# Patient Record
Sex: Male | Born: 1985 | Race: White | Hispanic: No | Marital: Married | Smoking: Never smoker
Health system: Southern US, Community
[De-identification: ages and names within clinical notes are randomized; demographics above are authoritative.]

## PROBLEM LIST (undated history)

## (undated) DIAGNOSIS — L709 Acne, unspecified: Secondary | ICD-10-CM

## (undated) SURGERY — Surgical Case
Anesthesia: *Unknown

---

## 2014-01-17 ENCOUNTER — Ambulatory Visit: Payer: Self-pay | Admitting: Family Medicine

## 2014-01-17 VITALS — BP 126/78 | HR 93 | Temp 98.8°F | Resp 17 | Ht 70.5 in | Wt 151.0 lb

## 2014-01-17 DIAGNOSIS — L678 Other hair color and hair shaft abnormalities: Secondary | ICD-10-CM

## 2014-01-17 DIAGNOSIS — L0100 Impetigo, unspecified: Secondary | ICD-10-CM

## 2014-01-17 DIAGNOSIS — L700 Acne vulgaris: Secondary | ICD-10-CM

## 2014-01-17 DIAGNOSIS — L739 Follicular disorder, unspecified: Secondary | ICD-10-CM

## 2014-01-17 DIAGNOSIS — L738 Other specified follicular disorders: Secondary | ICD-10-CM

## 2014-01-17 DIAGNOSIS — L708 Other acne: Secondary | ICD-10-CM

## 2014-01-17 MED ORDER — CHLORHEXIDINE GLUCONATE 4 % EX LIQD: Freq: Every day | CUTANEOUS | Status: DC | PRN

## 2014-01-17 MED ORDER — DOXYCYCLINE HYCLATE 100 MG PO CAPS: 100.0000 mg | ORAL_CAPSULE | Freq: Two times a day (BID) | ORAL | Status: DC

## 2014-01-17 NOTE — Progress Notes (Signed)
Subjective:    Patient ID: Timothy Graham Timothy Graham, male    DOB: February 08, 1986, 28 y.o.   MRN: [id number] Chief Complaint  Patient presents with  . rash all over body   This chart was scribed for Norberto SorensonEva Shaw, MD by Nicholos Johnsenise Iheanachor, Medical Scribe. This patient's care was started at 6:41 PM.  HPI HPI Comments: Timothy Graham is a 28 y.o. male who presents to the [facility] complaining of a diffuse rash located on his upper chest and back, neck and most recently lower arms. Pt states rash has present for a few months, but recently got worse over the last week. Pt states rash is sometimes draining clear honey colored fluid but more often draining a little pus. States he has been using topical tea tree oil with no relief. Has also tried topical creams and benzoyl peroxide body washes in the past but not recently - was very drying. Pt does not believe this has ever occurred before.Taking protein powder but denies recent use of any other supplements or any other topicals.   History reviewed. No pertinent past medical history.  No current outpatient prescriptions on file prior to visit.   No current facility-administered medications on file prior to visit.   Allergies  Allergen Reactions  . Latex Itching   Review of Systems  Constitutional: Negative for fever, chills, activity change and appetite change.  Cardiovascular: Negative for leg swelling.  Gastrointestinal: Negative for nausea, vomiting, abdominal pain and diarrhea.  Musculoskeletal: Negative for gait problem and joint swelling.  Skin: Positive for rash. Negative for wound.  Allergic/Immunologic: Negative for environmental allergies, food allergies and immunocompromised state.  Neurological: Negative for weakness and numbness.  Hematological: Negative for adenopathy. Does not bruise/bleed easily.   BP 126/78  Pulse 93  Temp(Src) 98.8 F (37.1 C) (Oral)  Resp 17  Ht 5' 10.5" (1.791 m)  Wt 151 lb (68.493 kg)  BMI  21.35 kg/m2  SpO2 98% Objective:  Physical Exam  Nursing note and vitals reviewed. Constitutional: He is oriented to person, place, and time. He appears well-developed and well-nourished. No distress.  HENT:  Head: Normocephalic and atraumatic.  Eyes: EOM are normal.  Neck: Neck supple. No tracheal deviation present.  Cardiovascular: Normal rate.   Pulmonary/Chest: Effort normal. No respiratory distress.  Musculoskeletal: Normal range of motion.  Neurological: He is alert and oriented to person, place, and time.  Skin: Skin is warm and dry. Rash noted. Rash is nodular and pustular. There is erythema.  Multiple pustules, vesicles, and nodules over upper back, chest, lower face, and forearms. Ranging in size from pin point pustules to 1 cm erythematous subcutaneous nodules. Small amount of honey crusting fluid seen from nodules on arm.   Psychiatric: He has a normal mood and affect. His behavior is normal.   Assessment & Plan:  Pt will be treated for a bacterial infection with doxycycline; advised to use once 2x/day for 3 weeks until sig clear. After that, he is instructed to decrease to once per day and cont until either completely gone or all meds used. Pt also advised to use anti bacterial body wash and benzoyl peroxide body washes followed by hypoallergenic moisturizer immed after all showers - Cetaphil, Aquaphor, eucerin.  If no improvement w/ this, will need to RTC for labs, culture. Nodular acne  Folliculitis  Impetigo  Meds ordered this encounter  Medications  . doxycycline (VIBRAMYCIN) 100 MG capsule    Sig: Take 1 capsule (100 mg total)  by mouth 2 (two) times daily. Decrease to qd after 2 wks    Dispense:  60 capsule    Refill:  1  . chlorhexidine (HIBICLENS) 4 % external liquid    Sig: Apply topically daily as needed.    Dispense:  237 mL    Refill:  2    I personally performed the services described in this documentation, which was scribed in my presence. The recorded  information has been reviewed and considered, and addended by me as needed.  Norberto Sorenson, MD MPH

## 2014-01-17 NOTE — Patient Instructions (Signed)
Folliculitis  Folliculitis is redness, soreness, and swelling (inflammation) of the hair follicles. This condition can occur anywhere on the body. People with weakened immune systems, diabetes, or obesity have a greater risk of getting folliculitis. CAUSES  Bacterial infection. This is the most common cause.  Fungal infection.  Viral infection.  Contact with certain chemicals, especially oils and tars. Long-term folliculitis can result from bacteria that live in the nostrils. The bacteria may trigger multiple outbreaks of folliculitis over time. SYMPTOMS Folliculitis most commonly occurs on the scalp, thighs, legs, back, buttocks, and areas where hair is shaved frequently. An early sign of folliculitis is a small, white or yellow, pus-filled, itchy lesion (pustule). These lesions appear on a red, inflamed follicle. They are usually less than 0.2 inches (5 mm) wide. When there is an infection of the follicle that goes deeper, it becomes a boil or furuncle. A group of closely packed boils creates a larger lesion (carbuncle). Carbuncles tend to occur in hairy, sweaty areas of the body. DIAGNOSIS  Your caregiver can usually tell what is wrong by doing a physical exam. A sample may be taken from one of the lesions and tested in a lab. This can help determine what is causing your folliculitis. TREATMENT  Treatment may include:  Applying warm compresses to the affected areas.  Taking antibiotic medicines orally or applying them to the skin.  Draining the lesions if they contain a large amount of pus or fluid.  Laser hair removal for cases of long-lasting folliculitis. This helps to prevent regrowth of the hair. HOME CARE INSTRUCTIONS  Apply warm compresses to the affected areas as directed by your caregiver.  If antibiotics are prescribed, take them as directed. Finish them even if you start to feel better.  You may take over-the-counter medicines to relieve itching.  Do not shave  irritated skin.  Follow up with your caregiver as directed. SEEK IMMEDIATE MEDICAL CARE IF:   You have increasing redness, swelling, or pain in the affected area.  You have a fever. MAKE SURE YOU:  Understand these instructions.  Will watch your condition.  Will get help right away if you are not doing well or get worse. Document Released: 02/24/2002 Document Revised: 06/16/2012 Document Reviewed: 03/17/2012 [facility] Patient Information 2014 [place], [place]. Acne Acne is a skin problem that causes pimples. Acne occurs when the pores in your skin get blocked. Your pores may become red, sore, and swollen (inflamed), or infected with a common skin bacterium (Propionibacterium acnes). Acne is a common skin problem. Up to 80% of people get acne at some time. Acne is especially common from the ages of 8112 to 7724. Acne usually goes away over time with proper treatment. CAUSES  Your pores each contain an oil gland. The oil glands make an oily substance called sebum. Acne happens when these glands get plugged with sebum, dead skin cells, and dirt. The P. acnes bacteria that are normally found in the oil glands then multiply, causing inflammation. Acne is commonly triggered by changes in your hormones. These hormonal changes can cause the oil glands to get bigger and to make more sebum. Factors that can make acne worse include:  Hormone changes during adolescence.  Hormone changes during women's menstrual cycles.  Hormone changes during pregnancy.  Oil-based cosmetics and hair products.  Harshly scrubbing the skin.  Strong soaps.  Stress.  Hormone problems due to certain diseases.  Long or oily hair rubbing against the skin.  Certain medicines.  Pressure from headbands, backpacks,  or shoulder pads.  Exposure to certain oils and chemicals. SYMPTOMS  Acne often occurs on the face, neck, chest, and upper back. Symptoms include:  Small, red bumps (pimples or papules).  Whiteheads  (closed comedones).  Blackheads (open comedones).  Small, pus-filled pimples (pustules).  Big, red pimples or pustules that feel tender. More severe acne can cause:  An infected area that contains a collection of pus (abscess).  Hard, painful, fluid-filled sacs (cysts).  Scars. DIAGNOSIS  Your caregiver can usually tell what the problem is by doing a physical exam. TREATMENT  There are many good treatments for acne. Some are available over-the-counter and some are available with a prescription. The treatment that is best for you depends on the type of acne you have and how severe it is. It may take 2 months of treatment before your acne gets better. Common treatments include:  Creams and lotions that prevent oil glands from clogging.  Creams and lotions that treat or prevent infections and inflammation.  Antibiotics applied to the skin or taken as a pill.  Pills that decrease sebum production.  Birth control pills.  Light or laser treatments.  Minor surgery.  Injections of medicine into the affected areas.  Chemicals that cause peeling of the skin. HOME CARE INSTRUCTIONS  Good skin care is the most important part of treatment.  Wash your skin gently at least twice a day and after exercise. Always wash your skin before bed.  Use mild soap.  After each wash, apply a water-based skin moisturizer.  Keep your hair clean and off of your face. Shampoo your hair daily.  Only take medicines as directed by your caregiver.  Use a sunscreen or sunblock with SPF 30 or greater. This is especially important when you are using acne medicines.  Choose cosmetics that are noncomedogenic. This means they do not plug the oil glands.  Avoid leaning your chin or forehead on your hands.  Avoid wearing tight headbands or hats.  Avoid picking or squeezing your pimples. This can make your acne worse and cause scarring. SEEK MEDICAL CARE IF:   Your acne is not better after 8  weeks.  Your acne gets worse.  You have a large area of skin that is red or tender. Document Released: 12/13/2000 Document Revised: 03/09/2012 Document Reviewed: 10/04/2011 [facility] Patient Information 2014 [place], [place]. Impetigo Impetigo is an infection of the skin, most common in babies and children.  CAUSES  It is caused by staphylococcal or streptococcal germs (bacteria). Impetigo can start after any damage to the skin. The damage to the skin may be from things like:   Chickenpox.  Scrapes.  Scratches.  Insect bites (common when children scratch the bite).  Cuts.  Nail biting or chewing. Impetigo is contagious. It can be spread from one person to another. Avoid close skin contact, or sharing towels or clothing. SYMPTOMS  Impetigo usually starts out as small blisters or pustules. Then they turn into tiny yellow-crusted sores (lesions).  There may also be:  Large blisters.  Itching or pain.  Pus.  Swollen lymph glands. With scratching, irritation, or non-treatment, these small areas may get larger. Scratching can cause the germs to get under the fingernails; then scratching another part of the skin can cause the infection to be spread there. DIAGNOSIS  Diagnosis of impetigo is usually made by a physical exam. A skin culture (test to grow bacteria) may be done to prove the diagnosis or to help decide the best treatment.  TREATMENT  Mild impetigo can be treated with prescription antibiotic cream. Oral antibiotic medicine may be used in more severe cases. Medicines for itching may be used. HOME CARE INSTRUCTIONS   To avoid spreading impetigo to other body areas:  Keep fingernails short and clean.  Avoid scratching.  Cover infected areas if necessary to keep from scratching.  Gently wash the infected areas with antibiotic soap and water.  Soak crusted areas in warm soapy water using antibiotic soap.  Gently rub the areas to remove crusts. Do not scrub.  Wash  hands often to avoid spread this infection.  Keep children with impetigo home from school or daycare until they have used an antibiotic cream for 48 hours (2 days) or oral antibiotic medicine for 24 hours (1 day), and their skin shows significant improvement.  Children may attend school or daycare if they only have a few sores and if the sores can be covered by a bandage or clothing. SEEK MEDICAL CARE IF:   More blisters or sores show up despite treatment.  Other family members get sores.  Rash is not improving after 48 hours (2 days) of treatment. SEEK IMMEDIATE MEDICAL CARE IF:   You see spreading redness or swelling of the skin around the sores.  You see red streaks coming from the sores.  Your child develops a fever of 100.4 F (37.2 C) or higher.  Your child develops a sore throat.  Your child is acting ill (lethargic, sick to their stomach). Document Released: 12/13/2000 Document Revised: 03/09/2012 Document Reviewed: 10/12/2008 [facility] Patient Information 2014 [place], [place].

## 2014-04-11 ENCOUNTER — Telehealth: Payer: Self-pay

## 2014-04-11 NOTE — Telephone Encounter (Signed)
Patient was seen on 01/17/2014 for an infection and states that he received a 60 day supply of Doxycyline. States that his infection is coming back. Doesn't know what to do. Wants more medicine.  [phone]

## 2014-04-11 NOTE — Telephone Encounter (Signed)
Spoke with pt. Pt's acne is returning and now it sounds like he is developing an axillary abscess. He is going to RTC tomorrow. He is currently still taking Doxy. Advised to stay on it until he comes in

## 2014-04-12 ENCOUNTER — Ambulatory Visit: Payer: Self-pay | Admitting: Family Medicine

## 2014-04-12 VITALS — BP 132/78 | HR 63 | Temp 97.9°F | Resp 18 | Ht 70.5 in | Wt 147.4 lb

## 2014-04-12 DIAGNOSIS — L708 Other acne: Secondary | ICD-10-CM

## 2014-04-12 DIAGNOSIS — IMO0002 Reserved for concepts with insufficient information to code with codable children: Secondary | ICD-10-CM

## 2014-04-12 DIAGNOSIS — L700 Acne vulgaris: Secondary | ICD-10-CM

## 2014-04-12 DIAGNOSIS — L02419 Cutaneous abscess of limb, unspecified: Secondary | ICD-10-CM

## 2014-04-12 MED ORDER — DOXYCYCLINE HYCLATE 100 MG PO CAPS: 100.0000 mg | ORAL_CAPSULE | Freq: Two times a day (BID) | ORAL | Status: DC

## 2014-04-12 NOTE — Patient Instructions (Signed)
Use Dove Sensitive Skin soap daily except once a week use  Dial Antibacterial soap.  Acne Acne is a skin problem that causes pimples. Acne occurs when the pores in your skin get blocked. Your pores may become red, sore, and swollen (inflamed), or infected with a common skin bacterium (Propionibacterium acnes). Acne is a common skin problem. Up to 80% of people get acne at some time. Acne is especially common from the ages of 6512 to 7624. Acne usually goes away over time with proper treatment. CAUSES  Your pores each contain an oil gland. The oil glands make an oily substance called sebum. Acne happens when these glands get plugged with sebum, dead skin cells, and dirt. The P. acnes bacteria that are normally found in the oil glands then multiply, causing inflammation. Acne is commonly triggered by changes in your hormones. These hormonal changes can cause the oil glands to get bigger and to make more sebum. Factors that can make acne worse include:  Hormone changes during adolescence.  Hormone changes during women's menstrual cycles.  Hormone changes during pregnancy.  Oil-based cosmetics and hair products.  Harshly scrubbing the skin.  Strong soaps.  Stress.  Hormone problems due to certain diseases.  Long or oily hair rubbing against the skin.  Certain medicines.  Pressure from headbands, backpacks, or shoulder pads.  Exposure to certain oils and chemicals. SYMPTOMS  Acne often occurs on the face, neck, chest, and upper back. Symptoms include:  Small, red bumps (pimples or papules).  Whiteheads (closed comedones).  Blackheads (open comedones).  Small, pus-filled pimples (pustules).  Big, red pimples or pustules that feel tender. More severe acne can cause:  An infected area that contains a collection of pus (abscess).  Hard, painful, fluid-filled sacs (cysts).  Scars. DIAGNOSIS  Your caregiver can usually tell what the problem is by doing a physical exam. TREATMENT    There are many good treatments for acne. Some are available over-the-counter and some are available with a prescription. The treatment that is best for you depends on the type of acne you have and how severe it is. It may take 2 months of treatment before your acne gets better. Common treatments include:  Creams and lotions that prevent oil glands from clogging.  Creams and lotions that treat or prevent infections and inflammation.  Antibiotics applied to the skin or taken as a pill.  Pills that decrease sebum production.  Birth control pills.  Light or laser treatments.  Minor surgery.  Injections of medicine into the affected areas.  Chemicals that cause peeling of the skin. HOME CARE INSTRUCTIONS  Good skin care is the most important part of treatment.  Wash your skin gently at least twice a day and after exercise. Always wash your skin before bed.  Use mild soap.  After each wash, apply a water-based skin moisturizer.  Keep your hair clean and off of your face. Shampoo your hair daily.  Only take medicines as directed by your caregiver.  Use a sunscreen or sunblock with SPF 30 or greater. This is especially important when you are using acne medicines.  Choose cosmetics that are noncomedogenic. This means they do not plug the oil glands.  Avoid leaning your chin or forehead on your hands.  Avoid wearing tight headbands or hats.  Avoid picking or squeezing your pimples. This can make your acne worse and cause scarring. SEEK MEDICAL CARE IF:   Your acne is not better after 8 weeks.  Your acne gets worse.  You have a large area of skin that is red or tender. Document Released: 12/13/2000 Document Revised: 03/09/2012 Document Reviewed: 10/04/2011 [facility] Patient Information 2014 [place], [place]. Abscess An abscess is an infected area that contains a collection of pus and debris.It can occur in almost any part of the body. An abscess is also known as a furuncle or  boil. CAUSES  An abscess occurs when tissue gets infected. This can occur from blockage of oil or sweat glands, infection of hair follicles, or a minor injury to the skin. As the body tries to fight the infection, pus collects in the area and creates pressure under the skin. This pressure causes pain. People with weakened immune systems have difficulty fighting infections and get certain abscesses more often.  SYMPTOMS Usually an abscess develops on the skin and becomes a painful mass that is red, warm, and tender. If the abscess forms under the skin, you may feel a moveable soft area under the skin. Some abscesses break open (rupture) on their own, but most will continue to get worse without care. The infection can spread deeper into the body and eventually into the bloodstream, causing you to feel ill.  DIAGNOSIS  Your caregiver will take your medical history and perform a physical exam. A sample of fluid may also be taken from the abscess to determine what is causing your infection. TREATMENT  Your caregiver may prescribe antibiotic medicines to fight the infection. However, taking antibiotics alone usually does not cure an abscess. Your caregiver may need to make a small cut (incision) in the abscess to drain the pus. In some cases, gauze is packed into the abscess to reduce pain and to continue draining the area. HOME CARE INSTRUCTIONS   Only take over-the-counter or prescription medicines for pain, discomfort, or fever as directed by your caregiver.  If you were prescribed antibiotics, take them as directed. Finish them even if you start to feel better.  If gauze is used, follow your caregiver's directions for changing the gauze.  To avoid spreading the infection:  Keep your draining abscess covered with a bandage.  Wash your hands well.  Do not share personal care items, towels, or whirlpools with others.  Avoid skin contact with others.  Keep your skin and clothes clean around the  abscess.  Keep all follow-up appointments as directed by your caregiver. SEEK MEDICAL CARE IF:   You have increased pain, swelling, redness, fluid drainage, or bleeding.  You have muscle aches, chills, or a general ill feeling.  You have a fever. MAKE SURE YOU:   Understand these instructions.  Will watch your condition.  Will get help right away if you are not doing well or get worse. Document Released: 09/25/2005 Document Revised: 06/16/2012 Document Reviewed: 02/28/2012 [facility] Patient Information 2014 [place]ExitCare, [place].

## 2014-04-12 NOTE — Progress Notes (Signed)
   Subjective:    Patient ID: Timothy RuffingZachary J Graham, male    DOB: 1986-02-26, 28 y.o.   MRN: [id number]  HPI Patient was seen by Dr. Clelia CroftShaw 1/15 and diagnosed with impetigo, folliculitis and acne. Patient reports that he had great improvement in symptoms within 2 weeks. Took antibiotics BID x 3 weeks. Rarely skipped.  Has noticed lesion under left arm that has gotten larger and more painful. He inserted a needle into it and had some relief of pain with drainage of white fluid.  Had a root canal this morning.  Review of Systems No fever, felt feverish last week, doesn't think this is related.     Objective:   Physical Exam  Vitals reviewed. Constitutional: He is oriented to person, place, and time. He appears well-developed and well-nourished.  HENT:  Right Ear: External ear normal.  Left Ear: External ear normal.  Eyes: Conjunctivae are normal.  Neck: Normal range of motion.  Pulmonary/Chest: Effort normal.  Musculoskeletal: Normal range of motion.  Neurological: He is alert and oriented to person, place, and time.  Skin: Skin is warm and dry. Rash noted.  Bilateral arms with multiple healed, darkened lesions.Upper back, chest, neck and lower face with erythematous pustules and macules.  Left axilla with 2 cm area of firm erythema. No drainage noted. Slightly tender to palpation.  Psychiatric: He has a normal mood and affect. His behavior is normal. Judgment and thought content normal.      Assessment & Plan:  1. Axillary abscess -patient has already drained this and not able to express anything today - doxycycline (VIBRAMYCIN) 100 MG capsule; Take 1 capsule (100 mg total) by mouth 2 (two) times daily. Decrease to qd after 1 week  Dispense: 60 capsule; Refill: 1 -Warm compresses 3x a day and ibuprofen for discomfort. -RTC if worsening or if no improvement with 1 week bid Doxycycline  2. Cystic acne -discussed continuing treatment with Doxycycline qd beyond 1 week treatment of abscess or  changing to topical treatment. Patient wishes to continue doxycycline. -Provided written and verbal information regarding skin care.   Emi Belfasteborah B. Fartun Paradiso, FNP-BC  [facility]  04/12/2014 3:21 PM

## 2014-04-18 NOTE — Progress Notes (Signed)
I have discussed this case with Ms. Gessner, NP and agree.  

## 2014-04-29 ENCOUNTER — Ambulatory Visit: Payer: Self-pay | Admitting: Physician Assistant

## 2014-04-29 VITALS — BP 110/72 | HR 83 | Temp 99.6°F | Resp 18 | Ht 70.0 in | Wt 146.0 lb

## 2014-04-29 DIAGNOSIS — J029 Acute pharyngitis, unspecified: Secondary | ICD-10-CM

## 2014-04-29 LAB — POCT RAPID STREP A (OFFICE): RAPID STREP A SCREEN: NEGATIVE

## 2014-04-29 MED ORDER — FIRST-DUKES MOUTHWASH MT SUSP: 10.0000 mL | OROMUCOSAL | Status: DC | PRN

## 2014-04-29 NOTE — Progress Notes (Signed)
   Subjective:    Patient ID: Timothy RuffingZachary J Graham, male    DOB: 1986/06/18, 28 y.o.   MRN: [id number]  HPI   Timothy Graham is a pleasant 28 yr old male here with concern for illness.  Thinks he may have strep throat.  Symptoms began 2 days ago.  Symptoms include sore throat, congestion, runny nose, subjective fever.  He has not taken anything for symptoms.  +sick contact at work.  Niece is also sick - unsure what she has.    He takes doxycycline for cystic acne - does not like taking medication daily, considering stopping this   Review of Systems  Constitutional: Positive for fever (subjective). Negative for chills.  HENT: Positive for congestion, rhinorrhea and sore throat. Negative for ear pain.   Respiratory: Positive for cough. Negative for shortness of breath and wheezing.   Cardiovascular: Negative.   Gastrointestinal: Negative.   Musculoskeletal: Negative.   Skin: Negative.        Objective:   Physical Exam  Vitals reviewed. Constitutional: He is oriented to person, place, and time. He appears well-developed and well-nourished. No distress.  HENT:  Head: Normocephalic and atraumatic.  Right Ear: Tympanic membrane and ear canal normal.  Left Ear: Tympanic membrane and ear canal normal.  Mouth/Throat: Uvula is midline and mucous membranes are normal. Posterior oropharyngeal erythema present. No oropharyngeal exudate or posterior oropharyngeal edema.  Eyes: Conjunctivae are normal. No scleral icterus.  Neck: Neck supple.  Cardiovascular: Normal rate, regular rhythm and normal heart sounds.   Pulmonary/Chest: Effort normal and breath sounds normal. He has no wheezes. He has no rales.  Lymphadenopathy:    He has no cervical adenopathy.  Neurological: He is alert and oriented to person, place, and time.  Skin: Skin is warm and dry.  Cystic/nodular acne across chest and back  Psychiatric: He has a normal mood and affect. His behavior is normal.    Results for orders placed in visit on  04/29/14  POCT RAPID STREP A (OFFICE)      Result Value Ref Range   Rapid Strep A Screen Negative  Negative       Assessment & Plan:  Acute pharyngitis - Plan: POCT rapid strep A, Diphenhyd-Hydrocort-Nystatin (FIRST-DUKES MOUTHWASH) SUSP, DISCONTINUED: Diphenhyd-Hydrocort-Nystatin (FIRST-DUKES MOUTHWASH) SUSP   Timothy Graham is a 28 yr old male here with 2 days of sore throat/URI symptoms.  Rapid strep is negative.  Suspect viral pharyngitis.  Will treat symptoms with ibuprofen, Duke's.  Discussed RTC precautions including fever, worsening throat pain.  Discussed options for pt's acne.  He does not want to take PO abx.  I do think this would offer the best control for him, but it is certainly not necessary for him to take this.  Topical medication may be beneficial, but this is costly and he is uninsured.  He has decided to stop the oral meds and continue antibacterial soap.  He also plans to clean up his diet in hopes that this will clear his skin.  He will RTC if he desires further management of his acne  Pt to call or RTC if worsening or not improving  E. Timothy FurbishElizabeth Timothy Graham MHS, PA-C [facility] 5/1/20156:35 PM

## 2014-04-29 NOTE — Patient Instructions (Signed)
Your strep test is negative today.  I suspect that a virus is causing your symptoms  Take ibuprofen 600mg  every 8 hours with food to help with throat pain  Use the magic mouthwash every 2 hours if needed for sore throat  Pick up some nasal saline spray at the pharmacy to help keep nasal passages and sinuses moist  Plenty of fluids (water is best!) and rest  Please let us know if any symptoms are worsening or not improving   Viral Pharyngitis Viral pharyngitis is a viral infection that produces redness, pain, and swelling (inflammation) of the throat. It can spread from person to person (contagious). CAUSES Viral pharyngitis is caused by inhaling a large amount of certain germs called viruses. Many different viruses cause viral pharyngitis. SYMPTOMS Symptoms of viral pharyngitis include:  Sore throat.  Tiredness.  Stuffy nose.  Low-grade fever.  Congestion.  Cough. TREATMENT Treatment includes rest, drinking plenty of fluids, and the use of over-the-counter medication (approved by your caregiver). HOME CARE INSTRUCTIONS   Drink enough fluids to keep your urine clear or pale yellow.  Eat soft, cold foods such as ice cream, frozen ice pops, or gelatin dessert.  Gargle with warm salt water (1 tsp salt per 1 qt of water).  If over age 87, throat lozenges may be used safely.  Only take over-the-counter or prescription medicines for pain, discomfort, or fever as directed by your caregiver. Do not take aspirin. To help prevent spreading viral pharyngitis to others, avoid:  Mouth-to-mouth contact with others.  Sharing utensils for eating and drinking.  Coughing around others. SEEK MEDICAL CARE IF:   You are better in a few days, then become worse.  You have a fever or pain not helped by pain medicines.  There are any other changes that concern you. Document Released: 09/25/2005 Document Revised: 03/09/2012 Document Reviewed: 02/21/2011 [facility] Patient Information  2014 [place], [place].

## 2014-05-29 ENCOUNTER — Observation Stay (HOSPITAL_COMMUNITY)
Admission: EM | Admit: 2014-05-29 | Discharge: 2014-05-30 | Disposition: A | Discharge status: Home / Self Care | Payer: No Typology Code available for payment source | Attending: Otolaryngology | Admitting: Otolaryngology

## 2014-05-29 ENCOUNTER — Observation Stay (HOSPITAL_COMMUNITY): Payer: No Typology Code available for payment source | Admitting: Certified Registered Nurse Anesthetist

## 2014-05-29 ENCOUNTER — Encounter (HOSPITAL_COMMUNITY): Payer: No Typology Code available for payment source | Admitting: Certified Registered Nurse Anesthetist

## 2014-05-29 ENCOUNTER — Encounter (HOSPITAL_COMMUNITY): Payer: Self-pay | Admitting: Emergency Medicine

## 2014-05-29 ENCOUNTER — Encounter (HOSPITAL_COMMUNITY)
Admission: EM | Disposition: A | Discharge status: Home / Self Care | Payer: Self-pay | Source: Home / Self Care | Attending: Emergency Medicine

## 2014-05-29 ENCOUNTER — Emergency Department (HOSPITAL_COMMUNITY): Payer: No Typology Code available for payment source

## 2014-05-29 DIAGNOSIS — S02620A Fracture of subcondylar process of mandible, unspecified side, initial encounter for closed fracture: Secondary | ICD-10-CM

## 2014-05-29 DIAGNOSIS — S02620B Fracture of subcondylar process of mandible, unspecified side, initial encounter for open fracture: Principal | ICD-10-CM | POA: Insufficient documentation

## 2014-05-29 DIAGNOSIS — S0266XB Fracture of symphysis of mandible, initial encounter for open fracture: Secondary | ICD-10-CM | POA: Insufficient documentation

## 2014-05-29 DIAGNOSIS — F17200 Nicotine dependence, unspecified, uncomplicated: Secondary | ICD-10-CM | POA: Insufficient documentation

## 2014-05-29 DIAGNOSIS — S02609B Fracture of mandible, unspecified, initial encounter for open fracture: Secondary | ICD-10-CM | POA: Diagnosis present

## 2014-05-29 DIAGNOSIS — M264 Malocclusion, unspecified: Secondary | ICD-10-CM | POA: Insufficient documentation

## 2014-05-29 HISTORY — DX: Acne, unspecified: L70.9

## 2014-05-29 HISTORY — PX: ORIF MANDIBULAR FRACTURE: SHX2127

## 2014-05-29 LAB — BASIC METABOLIC PANEL
BUN: 14 mg/dL (ref 6–23)
CO2: 25 mEq/L (ref 19–32)
CREATININE: 0.99 mg/dL (ref 0.50–1.35)
Calcium: 9.2 mg/dL (ref 8.4–10.5)
Chloride: 98 mEq/L (ref 96–112)
Glucose, Bld: 90 mg/dL (ref 70–99)
Potassium: 3.9 mEq/L (ref 3.7–5.3)
SODIUM: 140 meq/L (ref 137–147)

## 2014-05-29 LAB — PROTIME-INR
INR: 1.04 (ref 0.00–1.49)
Prothrombin Time: 13.4 seconds (ref 11.6–15.2)

## 2014-05-29 LAB — CBC WITH DIFFERENTIAL/PLATELET
BASOS PCT: 0 % (ref 0–1)
Basophils Absolute: 0 10*3/uL (ref 0.0–0.1)
EOS ABS: 0.2 10*3/uL (ref 0.0–0.7)
EOS PCT: 2 % (ref 0–5)
HEMATOCRIT: 48.3 % (ref 39.0–52.0)
Hemoglobin: 16.4 g/dL (ref 13.0–17.0)
LYMPHS PCT: 37 % (ref 12–46)
Lymphs Abs: 3.6 10*3/uL (ref 0.7–4.0)
MCH: 31.1 pg (ref 26.0–34.0)
MCHC: 34 g/dL (ref 30.0–36.0)
MCV: 91.5 fL (ref 78.0–100.0)
MONO ABS: 0.9 10*3/uL (ref 0.1–1.0)
Monocytes Relative: 9 % (ref 3–12)
Neutro Abs: 5.1 10*3/uL (ref 1.7–7.7)
Neutrophils Relative %: 52 % (ref 43–77)
Platelets: 227 10*3/uL (ref 150–400)
RBC: 5.28 MIL/uL (ref 4.22–5.81)
RDW: 14.1 % (ref 11.5–15.5)
WBC: 9.8 10*3/uL (ref 4.0–10.5)

## 2014-05-29 LAB — ETHANOL: ALCOHOL ETHYL (B): 124 mg/dL — AB (ref 0–11)

## 2014-05-29 LAB — SAMPLE TO BLOOD BANK

## 2014-05-29 LAB — GLUCOSE, CAPILLARY: GLUCOSE-CAPILLARY: 88 mg/dL (ref 70–99)

## 2014-05-29 SURGERY — OPEN REDUCTION INTERNAL FIXATION (ORIF) MANDIBULAR FRACTURE
Anesthesia: General | Site: Mouth

## 2014-05-29 MED ORDER — ONDANSETRON HCL 4 MG/2ML IJ SOLN: 4.0000 mg | Freq: Four times a day (QID) | INTRAMUSCULAR | Status: DC | PRN

## 2014-05-29 MED ORDER — HYDROMORPHONE HCL PF 1 MG/ML IJ SOLN
1.0000 mg | Freq: Once | INTRAMUSCULAR | Status: AC
  Administered 2014-05-29: 1 mg via INTRAVENOUS
  Filled 2014-05-29: qty 1

## 2014-05-29 MED ORDER — EPINEPHRINE HCL 1 MG/ML IJ SOLN
INTRAMUSCULAR | Status: AC
  Filled 2014-05-29: qty 1

## 2014-05-29 MED ORDER — HYDROCODONE-ACETAMINOPHEN 7.5-325 MG/15ML PO SOLN
15.0000 mL | ORAL | Status: DC | PRN
  Administered 2014-05-29 – 2014-05-30 (×4): 15 mL via ORAL
  Filled 2014-05-29 (×4): qty 15

## 2014-05-29 MED ORDER — SUCCINYLCHOLINE CHLORIDE 20 MG/ML IJ SOLN
INTRAMUSCULAR | Status: DC | PRN
  Administered 2014-05-29: 120 mg via INTRAVENOUS

## 2014-05-29 MED ORDER — OXYCODONE HCL 5 MG/5ML PO SOLN: 5.0000 mg | Freq: Once | ORAL | Status: DC | PRN

## 2014-05-29 MED ORDER — HYDROMORPHONE HCL PF 1 MG/ML IJ SOLN
0.2500 mg | INTRAMUSCULAR | Status: DC | PRN
  Administered 2014-05-29 (×4): 0.5 mg via INTRAVENOUS

## 2014-05-29 MED ORDER — HYDROMORPHONE HCL PF 1 MG/ML IJ SOLN
1.0000 mg | INTRAMUSCULAR | Status: DC | PRN
  Administered 2014-05-29 – 2014-05-30 (×6): 1 mg via INTRAVENOUS
  Filled 2014-05-29 (×6): qty 1

## 2014-05-29 MED ORDER — PHENYLEPHRINE 40 MCG/ML (10ML) SYRINGE FOR IV PUSH (FOR BLOOD PRESSURE SUPPORT)
PREFILLED_SYRINGE | INTRAVENOUS | Status: AC
  Filled 2014-05-29: qty 10

## 2014-05-29 MED ORDER — LIDOCAINE HCL (CARDIAC) 20 MG/ML IV SOLN
INTRAVENOUS | Status: DC | PRN
  Administered 2014-05-29: 70 mg via INTRAVENOUS

## 2014-05-29 MED ORDER — ONDANSETRON HCL 4 MG/2ML IJ SOLN
INTRAMUSCULAR | Status: AC
  Filled 2014-05-29: qty 2

## 2014-05-29 MED ORDER — OXYCODONE HCL 5 MG PO TABS: 5.0000 mg | ORAL_TABLET | Freq: Once | ORAL | Status: DC | PRN

## 2014-05-29 MED ORDER — MIDAZOLAM HCL 2 MG/2ML IJ SOLN
INTRAMUSCULAR | Status: AC
  Filled 2014-05-29: qty 2

## 2014-05-29 MED ORDER — DEXTROSE-NACL 5-0.45 % IV SOLN
INTRAVENOUS | Status: DC
Start: 2014-05-29 — End: 2014-05-29
  Administered 2014-05-29: 09:00:00 via INTRAVENOUS

## 2014-05-29 MED ORDER — HYDROMORPHONE HCL PF 1 MG/ML IJ SOLN
INTRAMUSCULAR | Status: AC
  Administered 2014-05-29: 0.5 mg via INTRAVENOUS
  Filled 2014-05-29: qty 1

## 2014-05-29 MED ORDER — LIDOCAINE-EPINEPHRINE 1 %-1:100000 IJ SOLN
INTRAMUSCULAR | Status: AC
  Filled 2014-05-29: qty 1

## 2014-05-29 MED ORDER — SODIUM CHLORIDE 0.9 % IV SOLN
3.0000 g | Freq: Four times a day (QID) | INTRAVENOUS | Status: DC
  Administered 2014-05-29: 3 g via INTRAVENOUS
  Filled 2014-05-29 (×4): qty 3

## 2014-05-29 MED ORDER — FENTANYL CITRATE 0.05 MG/ML IJ SOLN
INTRAMUSCULAR | Status: AC
  Filled 2014-05-29: qty 5

## 2014-05-29 MED ORDER — ONDANSETRON HCL 4 MG/2ML IJ SOLN
4.0000 mg | Freq: Once | INTRAMUSCULAR | Status: AC
  Administered 2014-05-29: 4 mg via INTRAVENOUS

## 2014-05-29 MED ORDER — HYDROMORPHONE HCL PF 1 MG/ML IJ SOLN
1.0000 mg | INTRAMUSCULAR | Status: DC | PRN
  Administered 2014-05-29: 1 mg via INTRAVENOUS
  Filled 2014-05-29: qty 1

## 2014-05-29 MED ORDER — PROPOFOL 10 MG/ML IV BOLUS
INTRAVENOUS | Status: DC | PRN
  Administered 2014-05-29: 200 mg via INTRAVENOUS

## 2014-05-29 MED ORDER — MIDAZOLAM HCL 5 MG/5ML IJ SOLN
INTRAMUSCULAR | Status: DC | PRN
  Administered 2014-05-29: 2 mg via INTRAVENOUS

## 2014-05-29 MED ORDER — LIDOCAINE-EPINEPHRINE 1 %-1:100000 IJ SOLN
INTRAMUSCULAR | Status: DC | PRN
  Administered 2014-05-29: 3 mL

## 2014-05-29 MED ORDER — HYDROCODONE-ACETAMINOPHEN 7.5-325 MG/15ML PO SOLN
ORAL | Status: AC
  Administered 2014-05-29: 15 mL via ORAL
  Filled 2014-05-29: qty 15

## 2014-05-29 MED ORDER — ONDANSETRON HCL 4 MG/2ML IJ SOLN
4.0000 mg | Freq: Once | INTRAMUSCULAR | Status: DC
  Filled 2014-05-29: qty 2

## 2014-05-29 MED ORDER — HYDROMORPHONE HCL PF 1 MG/ML IJ SOLN
1.0000 mg | INTRAMUSCULAR | Status: DC | PRN
  Administered 2014-05-29 (×2): 1 mg via INTRAVENOUS
  Filled 2014-05-29: qty 1

## 2014-05-29 MED ORDER — OXYMETAZOLINE HCL 0.05 % NA SOLN
NASAL | Status: DC | PRN
  Administered 2014-05-29: 1 via NASAL

## 2014-05-29 MED ORDER — OXYMETAZOLINE HCL 0.05 % NA SOLN
NASAL | Status: AC
  Filled 2014-05-29: qty 15

## 2014-05-29 MED ORDER — PROPOFOL 10 MG/ML IV BOLUS
INTRAVENOUS | Status: AC
  Filled 2014-05-29: qty 20

## 2014-05-29 MED ORDER — CLINDAMYCIN PHOSPHATE 600 MG/50ML IV SOLN
600.0000 mg | Freq: Three times a day (TID) | INTRAVENOUS | Status: DC
  Administered 2014-05-29 – 2014-05-30 (×2): 600 mg via INTRAVENOUS
  Filled 2014-05-29 (×5): qty 50

## 2014-05-29 MED ORDER — BACIT-POLY-NEO HC 1 % EX OINT
TOPICAL_OINTMENT | CUTANEOUS | Status: AC
  Filled 2014-05-29: qty 15

## 2014-05-29 MED ORDER — LIDOCAINE HCL (CARDIAC) 20 MG/ML IV SOLN
INTRAVENOUS | Status: AC
  Filled 2014-05-29: qty 5

## 2014-05-29 MED ORDER — DEXAMETHASONE SODIUM PHOSPHATE 4 MG/ML IJ SOLN
INTRAMUSCULAR | Status: DC | PRN
  Administered 2014-05-29: 8 mg via INTRAVENOUS

## 2014-05-29 MED ORDER — DEXTROSE-NACL 5-0.45 % IV SOLN: INTRAVENOUS | Status: DC

## 2014-05-29 MED ORDER — FENTANYL CITRATE 0.05 MG/ML IJ SOLN
INTRAMUSCULAR | Status: DC | PRN
  Administered 2014-05-29: 100 ug via INTRAVENOUS
  Administered 2014-05-29: 50 ug via INTRAVENOUS

## 2014-05-29 MED ORDER — ONDANSETRON HCL 4 MG/2ML IJ SOLN
INTRAMUSCULAR | Status: DC | PRN
  Administered 2014-05-29: 4 mg via INTRAVENOUS

## 2014-05-29 MED ORDER — LACTATED RINGERS IV SOLN
INTRAVENOUS | Status: DC | PRN
  Administered 2014-05-29 (×2): via INTRAVENOUS

## 2014-05-29 MED ORDER — SODIUM CHLORIDE 0.9 % IJ SOLN
INTRAMUSCULAR | Status: AC
Start: 2014-05-29 — End: 2014-05-29
  Filled 2014-05-29: qty 10

## 2014-05-29 MED ORDER — CLINDAMYCIN PHOSPHATE 600 MG/50ML IV SOLN
600.0000 mg | Freq: Once | INTRAVENOUS | Status: AC
  Administered 2014-05-29: 600 mg via INTRAVENOUS
  Filled 2014-05-29: qty 50

## 2014-05-29 MED ORDER — EPINEPHRINE HCL 1 MG/ML IJ SOLN
INTRAMUSCULAR | Status: AC
Start: 2014-05-29 — End: 2014-05-29
  Filled 2014-05-29: qty 1

## 2014-05-29 SURGICAL SUPPLY — 48 items
BIT DRILL TWIST 1.3X5 (BIT) ×1
BIT DRILL TWIST 1.3X5MM (BIT) IMPLANT
BIT DRILL TWIST 1.6X58MM (BIT) IMPLANT
BLADE 10 SAFETY STRL DISP (BLADE) ×3 IMPLANT
CANISTER SUCTION 2500CC (MISCELLANEOUS) ×3 IMPLANT
CLEANER TIP ELECTROSURG 2X2 (MISCELLANEOUS) ×3 IMPLANT
CONFORMERS SILICONE 5649 (OPHTHALMIC RELATED) IMPLANT
COVER SURGICAL LIGHT HANDLE (MISCELLANEOUS) ×6 IMPLANT
CRADLE DONUT ADULT HEAD (MISCELLANEOUS) IMPLANT
DECANTER SPIKE VIAL GLASS SM (MISCELLANEOUS) ×3 IMPLANT
DRILL BIT TWIST 1.3X5MM (BIT) ×3
DRILL TWIST 1.6X58MM (BIT) ×6
ELECT COATED BLADE 2.86 ST (ELECTRODE) ×3 IMPLANT
ELECT NDL BLADE 2-5/6 (NEEDLE) IMPLANT
ELECT NEEDLE BLADE 2-5/6 (NEEDLE) IMPLANT
ELECT REM PT RETURN 9FT ADLT (ELECTROSURGICAL) ×3
ELECTRODE REM PT RTRN 9FT ADLT (ELECTROSURGICAL) ×1 IMPLANT
GLOVE ECLIPSE 7.5 STRL STRAW (GLOVE) ×3 IMPLANT
GOWN STRL REUS W/ TWL LRG LVL3 (GOWN DISPOSABLE) ×2 IMPLANT
GOWN STRL REUS W/TWL LRG LVL3 (GOWN DISPOSABLE) ×6
KIT BASIN OR (CUSTOM PROCEDURE TRAY) ×3 IMPLANT
KIT ROOM TURNOVER OR (KITS) ×3 IMPLANT
NS IRRIG 1000ML POUR BTL (IV SOLUTION) ×3 IMPLANT
PAD ARMBOARD 7.5X6 YLW CONV (MISCELLANEOUS) ×6 IMPLANT
PATTIES SURGICAL .5 X3 (DISPOSABLE) IMPLANT
PENCIL FOOT CONTROL (ELECTRODE) ×3 IMPLANT
PLATE 4 H FRACTURE C SHAPE (Plate) ×2 IMPLANT
PLATE 8 H STRAIGHT MID FACE (Plate) ×2 IMPLANT
PLATE HYBRID MMF SM (Plate) ×4 IMPLANT
PLATE MID FACE 4H CURVED (Plate) ×2 IMPLANT
PLATE MNDBLE MINI 4H (Plate) ×2 IMPLANT
SCREW BONE CROSS PIN 2.0X10MM (Screw) ×2 IMPLANT
SCREW BONE CROSS PIN 2.0X12MM (Screw) ×6 IMPLANT
SCREW LOCK SELFDRIL 2.0X8M MMF (Screw) ×20 IMPLANT
SCREW MIDFACE 1.7X4 SLF DRILL (Screw) ×8 IMPLANT
SCREW MIDFACE 1.7X4MM SLF TAP (Screw) ×12 IMPLANT
SUT CHROMIC 3 0 PS 2 (SUTURE) ×3 IMPLANT
SUT CHROMIC 4 0 PS 2 18 (SUTURE) ×3 IMPLANT
SUT ETHILON 5 0 P 3 18 (SUTURE) ×2
SUT NYLON ETHILON 5-0 P-3 1X18 (SUTURE) ×1 IMPLANT
SUT SILK 2 0 FS (SUTURE) IMPLANT
SUT STEEL 0 (SUTURE)
SUT STEEL 0 18XMFL TIE 17 (SUTURE) IMPLANT
SUT STEEL 4 (SUTURE) ×3 IMPLANT
TOWEL OR 17X24 6PK STRL BLUE (TOWEL DISPOSABLE) ×3 IMPLANT
TOWEL OR 17X26 10 PK STRL BLUE (TOWEL DISPOSABLE) ×3 IMPLANT
TRAY ENT MC OR (CUSTOM PROCEDURE TRAY) ×3 IMPLANT
WATER STERILE IRR 1000ML POUR (IV SOLUTION) ×3 IMPLANT
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
## 2014-05-29 NOTE — Anesthesia Postprocedure Evaluation (Signed)
Anesthesia Post Note  Patient: Timothy Graham  Procedure(s) Performed: Procedure(s) (LRB): OPEN REDUCTION INTERNAL FIXATION (ORIF) MANDIBULAR FRACTURE (N/A)  Anesthesia type: General  Patient location: PACU  Post pain: Pain level controlled and Adequate analgesia  Post assessment: Post-op Vital signs reviewed, Patient's Cardiovascular Status Stable, Respiratory Function Stable, Patent Airway and Pain level controlled  Last Vitals:  Filed Vitals:   05/29/14 1607  BP: 132/86  Pulse: 79  Temp: 36.7 C  Resp: 16    Post vital signs: Reviewed and stable  Level of consciousness: awake, alert  and oriented  Complications: No apparent anesthesia complications

## 2014-05-29 NOTE — Progress Notes (Signed)
Family located and updated per patient's request. They were in the wrong waiting area and would like to speak with Dr. Jearld Fenton. Dr. Jearld Fenton notified and he will call into pt's room to discuss intra-op findings.

## 2014-05-29 NOTE — ED Notes (Addendum)
Dr. Byers, ENT at bedside 

## 2014-05-29 NOTE — Anesthesia Preprocedure Evaluation (Addendum)
Anesthesia Evaluation  Patient identified by MRN, date of birth, ID band Patient awake    Reviewed: Allergy & Precautions, H&P , NPO status , Patient's Chart, lab work & pertinent test results  Airway Mallampati: III TM Distance: >3 FB Neck ROM: Full  Mouth opening: Limited Mouth Opening  Dental  (+) Dental Advisory Given   Pulmonary Current Smoker,          Cardiovascular negative cardio ROS      Neuro/Psych    GI/Hepatic   Endo/Other    Renal/GU      Musculoskeletal   Abdominal   Peds  Hematology   Anesthesia Other Findings   Reproductive/Obstetrics                          Anesthesia Physical Anesthesia Plan  ASA: II  Anesthesia Plan:    Post-op Pain Management:    Induction: Intravenous  Airway Management Planned: Nasal ETT  Additional Equipment:   Intra-op Plan:   Post-operative Plan: Extubation in OR  Informed Consent: I have reviewed the patients History and Physical, chart, labs and discussed the procedure including the risks, benefits and alternatives for the proposed anesthesia with the patient or authorized representative who has indicated his/her understanding and acceptance.   Dental advisory given  Plan Discussed with: CRNA, Anesthesiologist and Surgeon  Anesthesia Plan Comments:         Anesthesia Quick Evaluation

## 2014-05-29 NOTE — Transfer of Care (Signed)
Immediate Anesthesia Transfer of Care Note  Patient: Timothy Graham  Procedure(s) Performed: Procedure(s): OPEN REDUCTION INTERNAL FIXATION (ORIF) MANDIBULAR FRACTURE (N/A)  Patient Location: PACU  Anesthesia Type:General  Level of Consciousness: patient cooperative  Airway & Oxygen Therapy: Patient Spontanous Breathing and Patient connected to nasal cannula oxygen  Post-op Assessment: Report given to PACU RN, Post -op Vital signs reviewed and stable and Patient moving all extremities X 4  Post vital signs: Reviewed and stable  Complications: No apparent anesthesia complications
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
## 2014-05-29 NOTE — ED Notes (Signed)
Attempted to call report
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
## 2014-05-29 NOTE — ED Notes (Signed)
Care assumed at this time.

## 2014-05-30 ENCOUNTER — Encounter (HOSPITAL_COMMUNITY): Payer: Self-pay | Admitting: Otolaryngology

## 2014-05-30 MED ORDER — CHLORHEXIDINE GLUCONATE 0.12 % MT SOLN: 15.0000 mL | Freq: Two times a day (BID) | OROMUCOSAL | Status: DC

## 2014-05-30 MED ORDER — CEPHALEXIN 250 MG/5ML PO SUSR: 500.0000 mg | Freq: Three times a day (TID) | ORAL | Status: DC

## 2014-05-30 MED ORDER — HYDROCODONE-ACETAMINOPHEN 7.5-325 MG/15ML PO SOLN: 15.0000 mL | ORAL | Status: DC | PRN
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
## 2014-05-30 NOTE — Progress Notes (Signed)
Patient discharged to home with instructions. 
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
## 2014-06-07 ENCOUNTER — Encounter (HOSPITAL_COMMUNITY): Payer: Self-pay | Admitting: Otolaryngology

## 2014-06-08 ENCOUNTER — Ambulatory Visit (HOSPITAL_COMMUNITY)
Admission: RE | Admit: 2014-06-08 | Discharge: 2014-06-08 | Disposition: A | Discharge status: Home / Self Care | Payer: No Typology Code available for payment source | Source: Ambulatory Visit | Attending: Otolaryngology | Admitting: Otolaryngology

## 2014-06-08 ENCOUNTER — Encounter (INDEPENDENT_AMBULATORY_CARE_PROVIDER_SITE_OTHER): Payer: Self-pay

## 2014-06-08 ENCOUNTER — Other Ambulatory Visit (HOSPITAL_COMMUNITY): Payer: Self-pay | Admitting: Otolaryngology

## 2014-06-08 DIAGNOSIS — S02401A Maxillary fracture, unspecified, initial encounter for closed fracture: Principal | ICD-10-CM | POA: Insufficient documentation

## 2014-06-08 DIAGNOSIS — S02400A Malar fracture unspecified, initial encounter for closed fracture: Secondary | ICD-10-CM | POA: Insufficient documentation

## 2014-06-08 DIAGNOSIS — S02609A Fracture of mandible, unspecified, initial encounter for closed fracture: Secondary | ICD-10-CM

## 2014-06-08 DIAGNOSIS — X58XXXA Exposure to other specified factors, initial encounter: Secondary | ICD-10-CM | POA: Insufficient documentation

## 2014-07-05 ENCOUNTER — Encounter (HOSPITAL_COMMUNITY): Payer: Self-pay | Admitting: Pharmacy Technician

## 2014-07-07 ENCOUNTER — Encounter (HOSPITAL_COMMUNITY): Payer: Self-pay

## 2014-07-07 ENCOUNTER — Encounter (HOSPITAL_COMMUNITY)
Admission: RE | Admit: 2014-07-07 | Discharge: 2014-07-07 | Disposition: A | Discharge status: Home / Self Care | Payer: No Typology Code available for payment source | Source: Ambulatory Visit | Attending: Otolaryngology | Admitting: Otolaryngology

## 2014-07-07 DIAGNOSIS — Z01812 Encounter for preprocedural laboratory examination: Secondary | ICD-10-CM | POA: Diagnosis present

## 2014-07-07 LAB — CBC
HCT: 44.5 % (ref 39.0–52.0)
Hemoglobin: 14.7 g/dL (ref 13.0–17.0)
MCH: 30.6 pg (ref 26.0–34.0)
MCHC: 33 g/dL (ref 30.0–36.0)
MCV: 92.7 fL (ref 78.0–100.0)
Platelets: 186 10*3/uL (ref 150–400)
RBC: 4.8 MIL/uL (ref 4.22–5.81)
RDW: 13.5 % (ref 11.5–15.5)
WBC: 6.7 10*3/uL (ref 4.0–10.5)

## 2014-07-07 NOTE — Pre-Procedure Instructions (Signed)
Timothy Graham  07/07/2014   Your procedure is scheduled on:  07/12/14  [facility] at 530 AM.  Call this number if you have problems the morning of surgery: [phone]   Remember:   Do not eat food or drink liquids after midnight.   Take these medicines the morning of surgery with A SIP OF WATER: none   Do not wear jewelry, make-up or nail polish.  Do not wear lotions, powders, or perfumes. You may wear deodorant.  Do not shave 48 hours prior to surgery. Men may shave face and neck.  Do not bring valuables to the hospital.  [facility] is not responsible                  for any belongings or valuables.               Contacts, dentures or bridgework may not be worn into surgery.  Leave suitcase in the car. After surgery it may be brought to your room.  For patients admitted to the hospital, discharge time is determined by your                treatment team.               Patients discharged the day of surgery will not be allowed to drive  home.  Name and phone number of your driver: family  Special Instructions: Shower using CHG 2 nights before surgery and the night before surgery.  If you shower the day of surgery use CHG.  Use special wash - you have one bottle of CHG for all showers.  You should use approximately 1/3 of the bottle for each shower.   Please read over the following fact sheets that you were given: Pain Booklet, Coughing and Deep Breathing and Surgical Site Infection Prevention

## 2014-07-08 ENCOUNTER — Other Ambulatory Visit: Payer: Self-pay | Admitting: Otolaryngology

## 2014-07-12 ENCOUNTER — Ambulatory Visit (HOSPITAL_COMMUNITY): Payer: No Typology Code available for payment source | Admitting: Anesthesiology

## 2014-07-12 ENCOUNTER — Ambulatory Visit (HOSPITAL_COMMUNITY)
Admission: RE | Admit: 2014-07-12 | Discharge: 2014-07-12 | Disposition: A | Discharge status: Home / Self Care | Payer: No Typology Code available for payment source | Source: Ambulatory Visit | Attending: Otolaryngology | Admitting: Otolaryngology

## 2014-07-12 ENCOUNTER — Encounter (HOSPITAL_COMMUNITY): Payer: Self-pay | Admitting: *Deleted

## 2014-07-12 ENCOUNTER — Encounter (HOSPITAL_COMMUNITY)
Admission: RE | Disposition: A | Discharge status: Home / Self Care | Payer: Self-pay | Source: Ambulatory Visit | Attending: Otolaryngology

## 2014-07-12 ENCOUNTER — Encounter (HOSPITAL_COMMUNITY): Payer: No Typology Code available for payment source | Admitting: Anesthesiology

## 2014-07-12 DIAGNOSIS — S02609D Fracture of mandible, unspecified, subsequent encounter for fracture with routine healing: Secondary | ICD-10-CM

## 2014-07-12 DIAGNOSIS — Z472 Encounter for removal of internal fixation device: Secondary | ICD-10-CM | POA: Insufficient documentation

## 2014-07-12 HISTORY — PX: MANDIBULAR HARDWARE REMOVAL: SHX5205

## 2014-07-12 SURGERY — REMOVAL, HARDWARE, MANDIBLE
Anesthesia: Monitor Anesthesia Care | Site: Mouth

## 2014-07-12 MED ORDER — LIDOCAINE HCL (CARDIAC) 20 MG/ML IV SOLN
INTRAVENOUS | Status: DC | PRN
  Administered 2014-07-12: 80 mg via INTRAVENOUS

## 2014-07-12 MED ORDER — PROPOFOL 10 MG/ML IV BOLUS
INTRAVENOUS | Status: DC | PRN
  Administered 2014-07-12: 150 mg via INTRAVENOUS

## 2014-07-12 MED ORDER — HYDROMORPHONE HCL PF 1 MG/ML IJ SOLN
0.2500 mg | INTRAMUSCULAR | Status: DC | PRN
  Administered 2014-07-12 (×2): 0.25 mg via INTRAVENOUS

## 2014-07-12 MED ORDER — ROCURONIUM BROMIDE 50 MG/5ML IV SOLN
INTRAVENOUS | Status: AC
  Filled 2014-07-12: qty 1

## 2014-07-12 MED ORDER — LIDOCAINE HCL (CARDIAC) 20 MG/ML IV SOLN
INTRAVENOUS | Status: AC
  Filled 2014-07-12: qty 5

## 2014-07-12 MED ORDER — PROPOFOL 10 MG/ML IV BOLUS
INTRAVENOUS | Status: AC
  Filled 2014-07-12: qty 20

## 2014-07-12 MED ORDER — LIDOCAINE-EPINEPHRINE 1 %-1:100000 IJ SOLN
INTRAMUSCULAR | Status: AC
  Filled 2014-07-12: qty 1

## 2014-07-12 MED ORDER — MIDAZOLAM HCL 2 MG/2ML IJ SOLN
INTRAMUSCULAR | Status: AC
  Filled 2014-07-12: qty 2

## 2014-07-12 MED ORDER — 0.9 % SODIUM CHLORIDE (POUR BTL) OPTIME
TOPICAL | Status: DC | PRN
  Administered 2014-07-12: 1000 mL

## 2014-07-12 MED ORDER — PROPOFOL INFUSION 10 MG/ML OPTIME
INTRAVENOUS | Status: DC | PRN
  Administered 2014-07-12: 140 ug/kg/min via INTRAVENOUS

## 2014-07-12 MED ORDER — ONDANSETRON HCL 4 MG/2ML IJ SOLN: 4.0000 mg | Freq: Once | INTRAMUSCULAR | Status: DC | PRN

## 2014-07-12 MED ORDER — HYDROMORPHONE HCL PF 1 MG/ML IJ SOLN
INTRAMUSCULAR | Status: AC
  Filled 2014-07-12: qty 1

## 2014-07-12 MED ORDER — FENTANYL CITRATE 0.05 MG/ML IJ SOLN
INTRAMUSCULAR | Status: AC
  Filled 2014-07-12: qty 5

## 2014-07-12 MED ORDER — ONDANSETRON HCL 4 MG/2ML IJ SOLN
INTRAMUSCULAR | Status: AC
  Filled 2014-07-12: qty 2

## 2014-07-12 MED ORDER — LACTATED RINGERS IV SOLN
INTRAVENOUS | Status: DC | PRN
  Administered 2014-07-12 (×2): via INTRAVENOUS

## 2014-07-12 MED ORDER — MIDAZOLAM HCL 5 MG/5ML IJ SOLN
INTRAMUSCULAR | Status: DC | PRN
  Administered 2014-07-12: 2 mg via INTRAVENOUS

## 2014-07-12 SURGICAL SUPPLY — 20 items
CANISTER SUCTION 2500CC (MISCELLANEOUS) ×3 IMPLANT
COVER SURGICAL LIGHT HANDLE (MISCELLANEOUS) ×3 IMPLANT
CRADLE DONUT ADULT HEAD (MISCELLANEOUS) IMPLANT
DRAPE PROXIMA HALF (DRAPES) ×3 IMPLANT
GAUZE SPONGE 4X4 16PLY XRAY LF (GAUZE/BANDAGES/DRESSINGS) ×2 IMPLANT
GLOVE ECLIPSE 7.5 STRL STRAW (GLOVE) ×3 IMPLANT
GLOVE SURG SS PI 6.0 STRL IVOR (GLOVE) ×2 IMPLANT
GOWN STRL REUS W/ TWL LRG LVL3 (GOWN DISPOSABLE) ×2 IMPLANT
GOWN STRL REUS W/TWL LRG LVL3 (GOWN DISPOSABLE) ×6
KIT BASIN OR (CUSTOM PROCEDURE TRAY) ×3 IMPLANT
KIT ROOM TURNOVER OR (KITS) ×3 IMPLANT
NEEDLE 27GAX1X1/2 (NEEDLE) ×3 IMPLANT
NS IRRIG 1000ML POUR BTL (IV SOLUTION) ×3 IMPLANT
PAD ARMBOARD 7.5X6 YLW CONV (MISCELLANEOUS) ×6 IMPLANT
SYR CONTROL 10ML LL (SYRINGE) ×3 IMPLANT
TOWEL OR 17X24 6PK STRL BLUE (TOWEL DISPOSABLE) ×6 IMPLANT
TRAY ENT MC OR (CUSTOM PROCEDURE TRAY) ×3 IMPLANT
TUBE CONNECTING 12'X1/4 (SUCTIONS) ×1
TUBE CONNECTING 12X1/4 (SUCTIONS) ×2 IMPLANT
YANKAUER SUCT BULB TIP NO VENT (SUCTIONS) ×3 IMPLANT

## 2014-07-12 NOTE — Anesthesia Postprocedure Evaluation (Signed)
  Anesthesia Post-op Note  Patient: Timothy Graham  Procedure(s) Performed: Procedure(s): MANDIBULAR HARDWARE REMOVAL (N/A)  Patient Location: PACU  Anesthesia Type:General  Level of Consciousness: awake, oriented, sedated and patient cooperative  Airway and Oxygen Therapy: Patient Spontanous Breathing  Post-op Pain: none  Post-op Assessment: Post-op Vital signs reviewed, Patient's Cardiovascular Status Stable, Respiratory Function Stable, Patent Airway, No signs of Nausea or vomiting and Pain level controlled  Post-op Vital Signs: stable  Last Vitals:  Filed Vitals:   07/12/14 0810  BP:   Pulse:   Temp: 36.4 C  Resp:     Complications: No apparent anesthesia complications

## 2014-07-12 NOTE — Anesthesia Procedure Notes (Signed)
Procedure Name: MAC Date/Time: 07/12/2014 7:46 AM Performed by: Quentin OreWALKER, Montague Corella E Pre-anesthesia Checklist: Patient identified, Emergency Drugs available, Suction available, Patient being monitored and Timeout performed Patient Re-evaluated:Patient Re-evaluated prior to inductionOxygen Delivery Method: Circle system utilized Preoxygenation: Pre-oxygenation with 100% oxygen Intubation Type: IV induction Ventilation: Mask ventilation throughout procedure Placement Confirmation: positive ETCO2 Dental Injury: Teeth and Oropharynx as per pre-operative assessment

## 2014-07-12 NOTE — Op Note (Signed)
Preop/postop diagnosis: Mandible fracture Procedure: Removal of hardware Anesthesia: Gen. Estimated blood loss less than 5 cc Indications: 28 year old previous mandible fracture now has had arch bars in position for many weeks and had the wires cut last week. He has a decent occlusion and no movement of his jaw bone. He is having no pain or swelling in  the fracture line area. Now removal of the arch bars is indicated. He was informed risks, benefits, and options and all questions are answered and consent was obtained. Procedure: Patient was taken to the operating room placed in supine position after general anesthesia with mask ventilation the upper arch or was removed by removing the screws it came off easily. The lower arch bar was also removed without difficulty removing all the screws. His wax debris was cleaned out. Grasping the mandible at the on the side of the fracture site the bones seems solid with no movement and the teeth were lined up reasonably well. The occlusion is still seemed  to be excellent. Patient was then suctioned out of all blood and debris and awakened and brought to recovery room in stable condition counts correct

## 2014-07-12 NOTE — H&P (Signed)
Timothy RuffingZachary J Graham is an 28 y.o. male.   Chief Complaint: jaw fracture HPI: Hx of mandible fx now needs hardware removed  Past Medical History  Diagnosis Date  . Acne     Past Surgical History  Procedure Laterality Date  . Orif mandibular fracture N/A 05/29/2014    Procedure: OPEN REDUCTION INTERNAL FIXATION (ORIF) MANDIBULAR FRACTURE;  Surgeon: Suzanna ObeyJohn Ivet Guerrieri, MD;  [facility];  Service: ENT;  Laterality: N/A;    History reviewed. No pertinent family history. Social History:  reports that he has never smoked. He does not have any smokeless tobacco history on file. He reports that he drinks about 4.2 ounces of alcohol per week. He reports that he does not use illicit drugs.  Allergies: No Known Allergies  Medications Prior to Admission  Medication Sig Dispense Refill  . ibuprofen (ADVIL,MOTRIN) 100 MG/5ML suspension Take 300 mg by mouth every 4 (four) hours as needed for mild pain.        No results found for this or any previous visit (from the past 48 hour(s)). No results found.  Review of Systems  Constitutional: Negative.   HENT: Negative.   Eyes: Negative.   Respiratory: Negative.   Cardiovascular: Negative.   Skin: Negative.     Blood pressure 105/62, pulse 62, temperature 97.7 F (36.5 C), temperature source Oral, resp. rate 18, SpO2 97.00%. Physical Exam  Constitutional: He appears well-developed and well-nourished.  HENT:  Head: Normocephalic and atraumatic.  Arch bars in place  Eyes: Pupils are equal, round, and reactive to light.  Neck: Normal range of motion. Neck supple.  Cardiovascular: Normal rate.   Respiratory: Effort normal.  GI: Soft.     Assessment/Plan Mandible fx- needs hardware removed and discussed procedure  Timothy Graham 07/12/2014, 7:30 AM

## 2014-07-12 NOTE — Progress Notes (Signed)
Valuables envelope taken to security. Copy of sheet brought back to bedside

## 2014-07-12 NOTE — Transfer of Care (Signed)
Immediate Anesthesia Transfer of Care Note  Patient: Timothy RuffingZachary J Vallee  Procedure(s) Performed: Procedure(s): MANDIBULAR HARDWARE REMOVAL (N/A)  Patient Location: PACU  Anesthesia Type:MAC  Level of Consciousness: sedated  Airway & Oxygen Therapy: Patient Spontanous Breathing and Patient connected to nasal cannula oxygen  Post-op Assessment: Report given to PACU RN, Post -op Vital signs reviewed and stable and Patient moving all extremities X 4  Post vital signs: Reviewed and stable  Complications: No apparent anesthesia complications

## 2014-07-12 NOTE — Anesthesia Preprocedure Evaluation (Signed)
Anesthesia Evaluation  Patient identified by MRN, date of birth, ID band Patient awake    Reviewed: Allergy & Precautions, H&P , NPO status , Patient's Chart, lab work & pertinent test results  Airway       Dental   Pulmonary          Cardiovascular     Neuro/Psych    GI/Hepatic   Endo/Other    Renal/GU      Musculoskeletal   Abdominal   Peds  Hematology   Anesthesia Other Findings   Reproductive/Obstetrics                           Anesthesia Physical Anesthesia Plan  ASA: I  Anesthesia Plan: General and MAC   Post-op Pain Management:    Induction: Intravenous  Airway Management Planned:   Additional Equipment:   Intra-op Plan:   Post-operative Plan:   Informed Consent: I have reviewed the patients History and Physical, chart, labs and discussed the procedure including the risks, benefits and alternatives for the proposed anesthesia with the patient or authorized representative who has indicated his/her understanding and acceptance.     Plan Discussed with:   Anesthesia Plan Comments:         Anesthesia Quick Evaluation

## 2014-07-15 ENCOUNTER — Encounter (HOSPITAL_COMMUNITY): Payer: Self-pay | Admitting: Otolaryngology
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
